# Patient Record
Sex: Male | Born: 1995 | Race: White | Hispanic: No | Marital: Single | Smoking: Never smoker
Health system: Southern US, Community
[De-identification: ages and names within clinical notes are randomized; demographics above are authoritative.]

## PROBLEM LIST (undated history)

## (undated) HISTORY — PX: CRANIOTOMY: SHX93

---
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
## 2015-05-16 ENCOUNTER — Encounter: Payer: Self-pay | Admitting: Emergency Medicine

## 2015-05-16 ENCOUNTER — Ambulatory Visit: Payer: Medicaid Other

## 2015-05-16 ENCOUNTER — Ambulatory Visit
Admission: EM | Admit: 2015-05-16 | Discharge: 2015-05-16 | Disposition: A | Discharge status: Home / Self Care | Payer: Medicaid Other | Attending: Family Medicine | Admitting: Family Medicine

## 2015-05-16 DIAGNOSIS — X58XXXA Exposure to other specified factors, initial encounter: Secondary | ICD-10-CM | POA: Insufficient documentation

## 2015-05-16 DIAGNOSIS — S71131A Puncture wound without foreign body, right thigh, initial encounter: Secondary | ICD-10-CM | POA: Diagnosis not present

## 2015-05-16 MED ORDER — SULFAMETHOXAZOLE-TRIMETHOPRIM 800-160 MG PO TABS: 1.0000 | ORAL_TABLET | Freq: Two times a day (BID) | ORAL | Status: AC

## 2015-05-16 MED ORDER — MUPIROCIN 2 % EX OINT: 1.0000 "application " | TOPICAL_OINTMENT | Freq: Three times a day (TID) | CUTANEOUS | Status: DC

## 2015-05-16 MED ORDER — TETANUS-DIPHTH-ACELL PERTUSSIS 5-2.5-18.5 LF-MCG/0.5 IM SUSP
0.5000 mL | Freq: Once | INTRAMUSCULAR | Status: AC
  Administered 2015-05-16: 0.5 mL via INTRAMUSCULAR

## 2015-05-16 NOTE — Discharge Instructions (Signed)
As we discussed, there my be a pink halo around the injury area in the next few days- however if it extends beyond fingertip size or becomes hot, inflamed or draining we need to see you back to re-evaluate I have ordered TMP-SMZ antibiotic twice daily for 7 days And topical mupirocin to apply after scrubbing wound as we discussed. Some local tenderness is to be expected as there may be some deep bruising from the post you may use a heat pad to the area as you wish Please return if you have questions or concerns  Puncture Wound A puncture wound is an injury that is caused by a sharp, thin object that goes through (penetrates) your skin. Usually, a puncture wound does not leave a large opening in your skin, so it may not bleed a lot. However, when you get a puncture wound, dirt or other materials (foreign bodies) can be forced into your wound and break off inside. This increases the chance of infection, such as tetanus. CAUSES Puncture wounds are caused by any sharp, thin object that goes through your skin, such as:  Animal teeth, as with an animal bite.  Sharp, pointed objects, such as nails, splinters of glass, fishhooks, and needles. SYMPTOMS Symptoms of a puncture wound include:  Pain.  Bleeding.  Swelling.  Bruising.  Fluid leaking from the wound.  Numbness, tingling, or loss of function. DIAGNOSIS This condition is diagnosed with a medical history and physical exam. Your wound will be checked to see if it contains any foreign bodies. You may also have X-rays or other imaging tests. TREATMENT Treatment for a puncture wound depends on how serious the wound is. It also depends on whether the wound contains any foreign bodies. Treatment for all types of puncture wounds usually starts with:  Controlling the bleeding.  Washing out the wound with a germ-free (sterile) salt-water solution.  Checking the wound for foreign bodies. Treatment may also include:  Having the wound opened  surgically to remove a foreign object.  Closing the wound with stitches (sutures) if it continues to bleed.  Covering the wound with antibiotic ointments and a bandage (dressing).  Receiving a tetanus shot.  Receiving a rabies vaccine. HOME CARE INSTRUCTIONS Medicines  Take or apply over-the-counter and prescription medicines only as told by your health care provider.  If you were prescribed an antibiotic, take or apply it as told by your health care provider. Do not stop using the antibiotic even if your condition improves. Wound Care  There are many ways to close and cover a wound. For example, a wound can be covered with sutures, skin glue, or adhesive strips. Follow instructions from your health care provider about:  How to take care of your wound.  When and how you should change your dressing.  When you should remove your dressing.  Removing whatever was used to close your wound.  Keep the dressing dry as told by your health care provider. Do not take baths, swim, use a hot tub, or do anything that would put your wound underwater until your health care provider approves.  Clean the wound as told by your health care provider.  Do not scratch or pick at the wound.  Check your wound every day for signs of infection. Watch for:  Redness, swelling, or pain.  Fluid, blood, or pus. General Instructions  Raise (elevate) the injured area above the level of your heart while you are sitting or lying down.  If your puncture wound is in  your foot, ask your health care provider if you need to avoid putting weight on your foot and for how long.  Keep all follow-up visits as told by your health care provider. This is important. SEEK MEDICAL CARE IF:  You received a tetanus shot and you have swelling, severe pain, redness, or bleeding at the injection site.  You have a fever.  Your sutures come out.  You notice a bad smell coming from your wound or your dressing.  You  notice something coming out of your wound, such as wood or glass.  Your pain is not controlled with medicine.  You have increased redness, swelling, or pain at the site of your wound.  You have fluid, blood, or pus coming from your wound.  You notice a change in the color of your skin near your wound.  You need to change the dressing frequently due to fluid, blood, or pus draining from your wound.  You develop a new rash.  You develop numbness around your wound. SEEK IMMEDIATE MEDICAL CARE IF:  You develop severe swelling around your wound.  Your pain suddenly increases and is severe.  You develop painful skin lumps.  You have a red streak going away from your wound.  The wound is on your hand or foot and you cannot properly move a finger or toe.  The wound is on your hand or foot and you notice that your fingers or toes look pale or bluish.   This information is not intended to replace advice given to you by your health care provider. Make sure you discuss any questions you have with your health care provider.   Document Released: 04/08/2005 Document Revised: 03/20/2015 Document Reviewed: 08/22/2014 Elsevier Interactive Patient Education [employer] Inc[employer]

## 2015-05-16 NOTE — ED Notes (Signed)
Pt had a rusty nail stuck into right thigh

## 2015-05-16 NOTE — ED Provider Notes (Signed)
CSN: [id number]     Arrival date & time 05/16/15  1539 History   First MD Initiated Contact with Patient 05/16/15 1606     Chief Complaint  Patient presents with  . Puncture Wound   (Consider location/radiation/quality/duration/timing/severity/associated sxs/prior Treatment) HPI 19 yo M reports pulling a fencepost out of the ground about an hour ago- did not notice a rusty nail extending- caught him in the right thigh Denies any previous tetanus Rx. 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 Ambulatory without assistance . Brings rusty bent nail from post with him.  At about 19 yo he was near the men pulling stumps with tractors and heavy chain when a chain ruptured and hit him in the forehead. Rushed to hospital with skull fractures and had cranial surgery with removal of fragmented frontal bone, replaced with a titanium plate by his report Generally does well. Weather changes can cause headaches or discomfort  History reviewed. No pertinent past medical history. Past Surgical History  Procedure Laterality Date  . 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 No pertinent family history. Social History  Substance Use Topics  . Smoking status: Never Smoker   . Smokeless tobacco: None  . Alcohol Use: No    Review of Systems  Constitutional: No fever.  Eyes: No visual changes. ENT:No sore throat. Cardiovascular:Negative for chest pain/palpitations Respiratory: Negative for shortness of breath Gastrointestinal: No abdominal pain. No nausea,vomiting, diarrhea Genitourinary: Negative for dysuria. Normal urination. Musculoskeletal: Negative for back pain. FROM extremities without pain- small wound  injury right thigh Skin: Negative for rash Neurological: Negative for headache, focal weakness or numbness   Allergies  Review of patient's  allergies indicates no known allergies.  Home Medications   Prior to Admission medications   Medication Sig Start Date End Date Taking? Authorizing Provider  mupirocin ointment (BACTROBAN) 2 % Apply 1 application topically 3 (three) times daily. 05/16/15   Rae HalstedLaurie W Amena Dockham, PA-C  sulfamethoxazole-trimethoprim (BACTRIM DS,SEPTRA DS) 800-160 MG tablet Take 1 tablet by mouth 2 (two) times daily. 05/16/15 05/23/15  Rae HalstedLaurie W Keyani Rigdon, PA-C   Meds Ordered and Administered this Visit   Medications  Tdap (BOOSTRIX) injection 0.5 mL (0.5 mLs Intramuscular Given 05/16/15 1610)  Given without difficutly. Well tolerated--discussed with him that he probably  had Tetanus with his cranial injury and with [employer] school requirements   BP 135/66 mmHg  Pulse 66  Temp(Src) 97.8 F (36.6 C) (Tympanic)  Resp 18  Ht 5' 9.5" (1.765 m)  Wt 143 lb (64.864 kg)  BMI 20.82 kg/m2  SpO2 99% No data found.   Physical Exam Constitutional: Alert and oriented, well appearing, VS are noted,  General : No acute distress; mildly concerned new injury Head:normocephalic, atraumatic,scarred from previous reported trauma/surgery  years ago  Eyes: conjugate gaze,negative conjunctiva,  Ears:Grossly normal hearing,  Mouth/throat :Mucous membranes moist,  Neck :  supple  Heart: Normal rate, regular rhythm Lung:    Normal respiratory effort and rate , no distress Back:    No CVAT, no spinal tenderness noted MSK:   nontender, normal ROM all extremities; ambulatory in unit, on and off table without assistance. Injury right medial thigh- has nail with him - no evidence breakage. Wound 3 mm, slightly teardrop shaped, c/w shape of distal nail point. No edema or eccchymosis at this time Neuro:Face symmetric, EOMI, PERRLA,tongue midline.  Grossly intact; good attention and recall,normal gait, normal speech and language, facial distortion with scarring Skin:  Warm,dry,intact Psych: mood and affect WNL  ED Course  Procedures (including critical  care time)  Labs Review Labs Reviewed - No data to display  Imaging Review Dg Femur 1v Right  05/16/2015  CLINICAL DATA:  Puncture wound to the right thigh. EXAM: RIGHT FEMUR 1 VIEW COMPARISON:  None. FINDINGS: The right hip joint is maintained. The femur is intact. No radiopaque foreign body is identified. There is a BB marking the site of the puncture wound. IMPRESSION: No fracture or radiopaque foreign body. Electronically Signed   By: Rudie Meyer M.D.   On: 05/16/2015 16:40    Wound medial thigh. Scrubbed with surgical scrub sponge. Probed with curved hemostat with no evidence of deeper tract. No possible to place wick. Films were negative for FB. No paresthesia   Good pulses and neurosensory below and surrounding wound Discussed potential for deeper contamination with puncture wounds- observation encouraged Discussed need to keep clean, gentle scrub daily , mupirocin and closed band-aid dressing with po antibiotic RX     MDM   1. Puncture wound of right thigh, initial encounter    Plan: X-ray results and diagnosis reviewed with patient Treat puncture wound for potential infection Rx as per orders;  benefits, risks, potential side effects reviewed   Recommend supportive treatment with ibuprofen or tylenol as needed Seek additional medical care if symptoms worsen or are not improving      Rae Halsted, PA-C 05/16/15 2242

## 2015-07-08 ENCOUNTER — Ambulatory Visit
Admission: EM | Admit: 2015-07-08 | Discharge: 2015-07-08 | Disposition: A | Discharge status: Home / Self Care | Payer: Medicaid Other | Attending: Family Medicine | Admitting: Family Medicine

## 2015-07-08 ENCOUNTER — Encounter: Payer: Self-pay | Admitting: Emergency Medicine

## 2015-07-08 DIAGNOSIS — J029 Acute pharyngitis, unspecified: Secondary | ICD-10-CM

## 2015-07-08 LAB — RAPID STREP SCREEN (MED CTR MEBANE ONLY): STREPTOCOCCUS, GROUP A SCREEN (DIRECT): NEGATIVE

## 2015-07-08 MED ORDER — AMOXICILLIN 875 MG PO TABS: 875.0000 mg | ORAL_TABLET | Freq: Two times a day (BID) | ORAL | Status: DC

## 2015-07-08 NOTE — ED Provider Notes (Signed)
[facility]  ____________________________________________  Time seen: Approximately 6:00 PM  I have reviewed the triage vital signs and the nursing notes.   HISTORY  Chief Complaint Sore Throat   HPI Glenn Friedman is a 19 y.o. male presents for the complaints of 3 days of sore throat. Patient reports mild nasal congestion occasionally. reports persistent sore throat. States sore throat is 6 out of 10 scratchy and hurts to swallow. Does report able to still continue to eat and drink but slight decrease in appetite. Denies pain radiation. Denies known fevers.   Denies neck pain back pain, cough, abdominal pain, dizziness, weakness, rash. Denies known sick contacts. States unrelieved with over-the-counter NyQuil and cough and congestion medicines.   History reviewed. No pertinent past medical history.  There are no active problems to display for this patient.   Past Surgical History  Procedure Laterality Date  . Craniotomy      7 yo child hit by tractor chain forehead with fragmented frontal cranial bones    Current Outpatient Rx  Name  Route  Sig  Dispense  Refill  .           Marland Kitchen.             Allergies Review of patient's allergies indicates no known allergies.  History reviewed. No pertinent family history.  Social History Social History  Substance Use Topics  . Smoking status: Never Smoker   . Smokeless tobacco: None  . Alcohol Use: No    Review of Systems Constitutional: No fever/chills Eyes: No visual changes. ENT: Positive sore throat. Cardiovascular: Denies chest pain. Respiratory: Denies shortness of breath. Gastrointestinal: No abdominal pain.  No nausea, no vomiting.  No diarrhea.  No constipation. Genitourinary: Negative for dysuria. Musculoskeletal: Negative for back pain. Skin: Negative for rash. Neurological: Negative for headaches, focal weakness or numbness.  10-point ROS otherwise  negative.  ____________________________________________   PHYSICAL EXAM:  VITAL SIGNS: ED Triage Vitals  Enc Vitals Group     BP 07/08/15 1525 137/74 mmHg     Pulse Rate 07/08/15 1525 84     Resp 07/08/15 1525 16     Temp 07/08/15 1525 98.5 F (36.9 C)     Temp Source 07/08/15 1525 Tympanic     SpO2 07/08/15 1525 100 %     Weight 07/08/15 1525 143 lb (64.864 kg)     Height 07/08/15 1525 5\' 9"  (1.753 m)     Head Cir --      Peak Flow --      Pain Score 07/08/15 1527 2     Pain Loc --      Pain Edu? --      Excl. in GC? --     Constitutional: Alert and oriented. Well appearing and in no acute distress. Eyes: Conjunctivae are normal. PERRL. EOMI. Head: Atraumatic. No sinus tenderness to palpation. No swelling. No erythema.  Ears: no erythema, normal TMs bilaterally.   Nose: Mild clear rhinorrhea. Nares patent.  Mouth/Throat: Mucous membranes are moist.  Moderate pharyngeal erythema with 3+ bilateral tonsillar swelling. No pharyngeal exudate. No uvular shift or deviation. Tolerating fluids in room. Neck: No stridor.  No cervical spine tenderness to palpation. Hematological/Lymphatic/Immunilogical: Mild anterior cervical lymphadenopathy. Cardiovascular: Normal rate, regular rhythm. Grossly normal heart sounds.  Good peripheral circulation. Respiratory: Normal respiratory effort.  No retractions. Lungs CTAB. No wheezes, rales or rhonchi. Good air movement. Gastrointestinal: Soft and nontender. No distention. Normal Bowel sounds.  No hepatomegaly or splenomegaly palpated. Musculoskeletal:  No lower or upper extremity tenderness nor edema.   Bilateral pedal pulses equal and easily palpated.  Neurologic:  Normal speech and language. No gross focal neurologic deficits are appreciated. No gait instability. Skin:  Skin is warm, dry and intact. No rash noted. Psychiatric: Mood and affect are normal. Speech and behavior are normal.  ____________________________________________    LABS (all labs ordered are listed, but only abnormal results are displayed)  Labs Reviewed  RAPID STREP SCREEN (NOT AT [facility])  CULTURE, GROUP A STREP ([facility] ONLY)     INITIAL IMPRESSION / ASSESSMENT AND PLAN / ED COURSE  Pertinent labs & imaging results that were available during my care of the patient were reviewed by me and considered in my medical decision making (see chart for details).  Very well-appearing patient. No acute distress. Presents for the complaints of sore throat 3 days. Lungs clear throughout. Abdomen soft and nontender. Moist mucous membranes. Reports continues to eat and drink well.  Moderate pharyngeal erythema and tonsillar swelling. Quick strep negative, will culture. Concern for streptococcal pharyngitis. Due to tonsillar swelling and erythema will go ahead and treat with oral amoxicillin due to concern for streptococcal pharyngitis.. Counseled regarding rest, fluids, over-the-counter Tylenol or ibuprofen as needed. Counsel PCP follow up. Counseled regarding of sore throat continues to have close follow-up.   Discussed follow up with Primary care physician this week. Discussed follow up and return parameters including no resolution or any worsening concerns. Patient verbalized understanding and agreed to plan.   ____________________________________________   FINAL CLINICAL IMPRESSION(S) / ED DIAGNOSES  Final diagnoses:  Pharyngitis       Renford Dills, NP 07/08/15 1807

## 2015-07-08 NOTE — Discharge Instructions (Signed)

## 2015-07-08 NOTE — ED Notes (Signed)
Patient c/o sore throat for 3 days

## 2015-07-11 LAB — CULTURE, GROUP A STREP (THRC)

## 2015-11-11 ENCOUNTER — Ambulatory Visit
Admission: EM | Admit: 2015-11-11 | Discharge: 2015-11-11 | Disposition: A | Discharge status: Home / Self Care | Payer: Medicaid Other | Attending: Family Medicine | Admitting: Family Medicine

## 2015-11-11 ENCOUNTER — Encounter: Payer: Self-pay | Admitting: *Deleted

## 2015-11-11 DIAGNOSIS — L255 Unspecified contact dermatitis due to plants, except food: Secondary | ICD-10-CM | POA: Diagnosis not present

## 2015-11-11 MED ORDER — LORATADINE 10 MG PO TABS: 10.0000 mg | ORAL_TABLET | Freq: Every day | ORAL | Status: DC

## 2015-11-11 MED ORDER — CETIRIZINE HCL 10 MG PO TABS: 10.0000 mg | ORAL_TABLET | Freq: Every day | ORAL | Status: DC

## 2015-11-11 MED ORDER — RANITIDINE HCL 150 MG PO CAPS: 150.0000 mg | ORAL_CAPSULE | Freq: Two times a day (BID) | ORAL | Status: AC

## 2015-11-11 MED ORDER — PREDNISONE 10 MG (21) PO TBPK: ORAL_TABLET | ORAL | Status: DC

## 2015-11-11 NOTE — ED Provider Notes (Addendum)
CSN: [id number]     Arrival date & time 11/11/15  1811 History   First MD Initiated Contact with Patient 11/11/15 2026    Nurses notes were reviewed. Chief Complaint  Patient presents with  . Poison Ivy  Patient is here because of poison ivy. States he was out in the woods on Friday by Friday evening he was itching. States he washes hands this despite that still itched nothing seemed to help. He reports that she has gotten worse. Last poison ivy or poison oak ecchymosis on the hand he didn't have it on his face but now spread to space chest and to his back. And abdomen. His nausea symptoms been on oral prednisone for.  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 He's had a craniotomy is a 53-year-old child smoke.Significant medical history other than cutting history and no significant family medical problems.  (Consider location/radiation/quality/duration/timing/severity/associated sxs/prior Treatment) Patient is a 20 y.o. male presenting with poison ivy. The history is provided by the patient. No language interpreter was used.  Poison [place] This is a new problem. The current episode started more than 2 days ago. The problem occurs constantly. The problem has been gradually worsening. Pertinent negatives include no chest pain, no abdominal pain, no headaches and no shortness of breath. Nothing aggravates the symptoms. Nothing relieves the symptoms. Treatments tried: Anti-itch creams. The treatment provided no relief.    History reviewed. No pertinent past medical history. Past Surgical History  Procedure Laterality Date  . 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 No pertinent family history. Social History  Substance Use Topics  . Smoking status: Never Smoker   . Smokeless tobacco: Never Used  . Alcohol Use: No    Review of Systems  HENT: Positive for facial  swelling.   Respiratory: Negative for shortness of breath.   Cardiovascular: Negative for chest pain.  Gastrointestinal: Negative for abdominal pain.  Skin: Positive for rash. Negative for color change.  Neurological: Negative for headaches.  All other systems reviewed and are negative.   Allergies  Gluten meal and Poison ivy extract  Home Medications   Prior to Admission medications   Medication Sig Start Date End Date Taking? Authorizing Provider  amoxicillin (AMOXIL) 875 MG tablet Take 1 tablet (875 mg total) by mouth 2 (two) times daily. 07/08/15   Renford Dills, NP  cetirizine (ZYRTEC) 10 MG tablet Take 1 tablet (10 mg total) by mouth daily. If needed at night for itching not relieved by Claritin in the morning. 11/11/15   Hassan Rowan, MD  loratadine (CLARITIN) 10 MG tablet Take 1 tablet (10 mg total) by mouth daily. Take 1 tablet in the morning. As needed for itching. 11/11/15   Hassan Rowan, MD  mupirocin ointment (BACTROBAN) 2 % Apply 1 application topically 3 (three) times daily. 05/16/15   Rae Halsted, PA-C  predniSONE (STERAPRED UNI-PAK 21 TAB) 10 MG (21) TBPK tablet 6 tabs day 1 and 2, 5 tabs day 3 and 4, 4 tabs day 5 and 6, 3 tabs day 7 and 8, 2 tabs day 9 and 10, 1 tab day 11 and 12. Take orally 11/11/15   Hassan Rowan, MD  ranitidine (ZANTAC) 150 MG capsule Take 1 capsule (150 mg total) by mouth 2 (two) times daily. 11/11/15   Hassan Rowan, MD   Meds Ordered and Administered this  Visit  Medications - No data to display  BP 131/85 mmHg  Pulse 70  Temp(Src) 98.6 F (37 C) (Oral)  Resp 18  Ht 5\' 9"  (1.753 m)  Wt 150 lb (68.04 kg)  BMI 22.14 kg/m2  SpO2 100% No data found.   Physical Exam  Constitutional: He is oriented to person, place, and time. He appears well-developed and well-nourished.  HENT:  Head: Normocephalic and atraumatic.  Eyes: Pupils are equal, round, and reactive to light.  Neck: Normal range of motion. No tracheal deviation present.  Musculoskeletal:  Normal range of motion.  Neurological: He is alert and oriented to person, place, and time.  Skin: Rash noted.     Scars present on the left forearm where he's had himself before. Over the general body he has a rash consistent with contact dermatitischest and on both arms.  Psychiatric: He has a normal mood and affect. His behavior is normal.  Vitals reviewed.   ED Course  Procedures (including critical care time)  Labs Review Labs Reviewed - No data to display  Imaging Review No results found.   Visual Acuity Review  Right Eye Distance:   Left Eye Distance:   Bilateral Distance:    Right Eye Near:   Left Eye Near:    Bilateral Near:         MDM   1. Contact dermatitis due to plant   Will place on 12 day course of dose pack. Stressed the need to take all 12 days worth. Place on Claritin 10 mg in the morning and Zyrtec 10 mg at night for the itching. Zantac twice a day also help with itching stay away from Benadryl and follow-up PCP if not better in 2 weeks. He was informed that the Claritin Zyrtec can be used for itching as well as Zantac and those medicines are when necessary medications. The prednisone he has take the full 12 days until completely gone..   Note: This dictation was prepared with Dragon dictation along with smaller phrase technology. Any transcriptional errors that result from this process are unintentional.    Hassan RowanEugene Jung Yurchak, MD 11/11/15 [id number]  Hassan RowanEugene Bular Hickok, MD 11/11/15 2054

## 2015-11-11 NOTE — Discharge Instructions (Signed)
[facility] Po[facility] oak is a rash caused by touching the leaves of the poison oak plant. You may have a rash with redness and itching. Sometimes, blisters appear and break open. Your eyes may get puffy (swollen). Poison oak often heals in 2 to 3 weeks without treatment.  HOME CARE  If you touch poison oak:  Wash your skin with soap and water right away. Wash under your fingernails. Do not rub the skin very hard.  Wash any clothes you were wearing.  Avoid poison oak in the future. Poison oak usually has 3 leaves on a stem.  Use medicines to help with itching as told by your doctor. Do not drive when you take this medicine.  Keep open sores dry, clean, and covered with a bandage and medicated cream, if needed.  Ask your doctor about medicine for children. GET HELP RIGHT AWAY IF:  You have open sores.  Redness spreads beyond the area of the rash.  There is yellowish white fluid (pus) coming from the rash.  Pain gets worse.  You have a temperature by mouth above 102 F (38.9 C), not controlled by medicine. MAKE SURE YOU:  Understand these instructions.  Will watch your condition.  Will get help right away if you are not doing well or get worse.   This information is not intended to replace advice given to you by your health care provider. Make sure you discuss any questions you have with your health care provider.   Document Released: 08/01/2010 Document Revised: 09/21/2011 Document Reviewed: 12/05/2014 Elsevier Interactive Patient Education 2016 Elsevier Inc.  Contact Dermatitis Dermatitis is redness, soreness, and swelling (inflammation) of the skin. Contact dermatitis is a reaction to certain substances that touch the skin. You either touched something that irritated your skin, or you have allergies to something you touched.  HOME CARE  Skin Care  Moisturize your skin as needed.  Apply cool compresses to the affected areas.   Try taking a bath with:   Epsom salts.  Follow the instructions on the package. You can get these at a pharmacy or grocery store.   Baking soda. Pour a small amount into the bath as told by your doctor.   Colloidal oatmeal. Follow the instructions on the package. You can get this at a pharmacy or grocery store.   Try applying baking soda paste to your skin. Stir water into baking soda until it looks like paste.  Do not scratch your skin.   Bathe less often.  Bathe in lukewarm water. Avoid using hot water.  Medicines  Take or apply over-the-counter and prescription medicines only as told by your doctor.   If you were prescribed an antibiotic medicine, take or apply your antibiotic as told by your doctor. Do not stop taking the antibiotic even if your condition starts to get better. General Instructions  Keep all follow-up visits as told by your doctor. This is important.   Avoid the substance that caused your reaction. If you do not know what caused it, keep a journal to try to track what caused it. Write down:   What you eat.   What cosmetic products you use.   What you drink.   What you wear in the affected area. This includes jewelry.   If you were given a bandage (dressing), take care of it as told by your doctor. This includes when to change and remove it.  GET HELP IF:   You do not get better with treatment.  Your condition gets worse.   You have signs of infection such as:  Swelling.  Tenderness.  Redness.  Soreness.  Warmth.   You have a fever.   You have new symptoms.  GET HELP RIGHT AWAY IF:   You have a very bad headache.  You have neck pain.  Your neck is stiff.   You throw up (vomit).   You feel very sleepy.   You see red streaks coming from the affected area.   Your bone or joint underneath the affected area becomes painful after the skin has healed.   The affected area turns darker.   You have trouble breathing.    This information is not  intended to replace advice given to you by your health care provider. Make sure you discuss any questions you have with your health care provider.   Document Released: 04/26/2009 Document Revised: 03/20/2015 Document Reviewed: 11/14/2014 Elsevier Interactive Patient Education 2016 Elsevier Inc.  Rash A rash is a change in the color or feel of your skin. There are many different types of rashes. You may have other problems along with your rash. HOME CARE  Avoid the thing that caused your rash.  Do not scratch your rash.  You may take cools baths to help stop itching.  Only take medicines as told by your doctor.  Keep all doctor visits as told. GET HELP RIGHT AWAY IF:   Your pain, puffiness (swelling), or redness gets worse.  You have a fever.  You have new or severe problems.  You have body aches, watery poop (diarrhea), or you throw up (vomit).  Your rash is not better after 3 days. MAKE SURE YOU:   Understand these instructions.  Will watch your condition.  Will get help right away if you are not doing well or get worse.   This information is not intended to replace advice given to you by your health care provider. Make sure you discuss any questions you have with your health care provider.   Document Released: 12/16/2007 Document Revised: 09/21/2011 Document Reviewed: 11/14/2014 Elsevier Interactive Patient Education [employer]

## 2015-11-11 NOTE — ED Notes (Signed)
Patient was exposed to poison ivy 3 days ago and immediately began breaking out into a rash. Patient has a history of allergies to poison ivy.

## 2016-01-17 ENCOUNTER — Ambulatory Visit
Admission: EM | Admit: 2016-01-17 | Discharge: 2016-01-17 | Disposition: A | Discharge status: Home / Self Care | Payer: Medicaid Other | Attending: Family Medicine | Admitting: Family Medicine

## 2016-01-17 DIAGNOSIS — L237 Allergic contact dermatitis due to plants, except food: Secondary | ICD-10-CM

## 2016-01-17 MED ORDER — TRIAMCINOLONE ACETONIDE 0.025 % EX OINT: 1.0000 "application " | TOPICAL_OINTMENT | Freq: Two times a day (BID) | CUTANEOUS | Status: DC

## 2016-01-17 MED ORDER — PREDNISONE 20 MG PO TABS: 20.0000 mg | ORAL_TABLET | Freq: Every day | ORAL | Status: DC

## 2016-01-17 NOTE — ED Provider Notes (Signed)
CSN: [id number]     Arrival date & time 01/17/16  1256 History   First MD Initiated Contact with Patient 01/17/16 1404     Chief Complaint  Patient presents with  . Rash   (Consider location/radiation/quality/duration/timing/severity/associated sxs/prior Treatment) HPI Comments: 20 yo male with c/o itchy rash to the arms and abdomen after exposure to poison oak. Has tried over the counter topical medications without relief.   Patient is a 20 y.o. male presenting with rash. The history is provided by the patient.  Rash   History reviewed. No pertinent past medical history. Past Surgical History  Procedure Laterality Date  . 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 No pertinent family history. Social History  Substance Use Topics  . Smoking status: Never Smoker   . Smokeless tobacco: Never Used  . Alcohol Use: No    Review of Systems  Skin: Positive for rash.    Allergies  Gluten meal and Poison ivy extract  Home Medications   Prior to Admission medications   Medication Sig Start Date End Date Taking? Authorizing Provider  amoxicillin (AMOXIL) 875 MG tablet Take 1 tablet (875 mg total) by mouth 2 (two) times daily. 07/08/15   Renford DillsLindsey Miller, NP  cetirizine (ZYRTEC) 10 MG tablet Take 1 tablet (10 mg total) by mouth daily. If needed at night for itching not relieved by Claritin in the morning. 11/11/15   Hassan RowanEugene Wade, MD  loratadine (CLARITIN) 10 MG tablet Take 1 tablet (10 mg total) by mouth daily. Take 1 tablet in the morning. As needed for itching. 11/11/15   Hassan RowanEugene Wade, MD  mupirocin ointment (BACTROBAN) 2 % Apply 1 application topically 3 (three) times daily. 05/16/15   Rae HalstedLaurie W Lee, PA-C  predniSONE (DELTASONE) 20 MG tablet Take 1 tablet (20 mg total) by mouth daily. 01/17/16   Payton Mccallumrlando Vila Dory, MD  ranitidine (ZANTAC) 150 MG capsule Take 1 capsule (150 mg total) by mouth 2 (two) times daily. 11/11/15   Hassan RowanEugene  Wade, MD  triamcinolone (KENALOG) 0.025 % ointment Apply 1 application topically 2 (two) times daily. 01/17/16   Payton Mccallumrlando Preethi Scantlebury, MD   Meds Ordered and Administered this Visit  Medications - No data to display  BP 123/60 mmHg  Pulse 82  Temp(Src) 97.8 F (36.6 C) (Tympanic)  Resp 17  Ht 5\' 10"  (1.778 m)  Wt 145 lb (65.772 kg)  BMI 20.81 kg/m2  SpO2 98% No data found.   Physical Exam  Constitutional: He appears well-developed and well-nourished. No distress.  Skin: Rash noted. He is not diaphoretic.  Cluster skin areas with pinpoint blisters with surrounding erythema on forearms, abdominal skin  Nursing note and vitals reviewed.   ED Course  Procedures (including critical care time)  Labs Review Labs Reviewed - No data to display  Imaging Review No results found.   Visual Acuity Review  Right Eye Distance:   Left Eye Distance:   Bilateral Distance:    Right Eye Near:   Left Eye Near:    Bilateral Near:         MDM   1. Contact dermatitis due to poison ivy    Discharge Medication List as of 01/17/2016  2:17 PM    START taking these medications   Details  predniSONE (DELTASONE) 20 MG tablet Take 1 tablet (20 mg total) by mouth daily., Starting 01/17/2016, Until Discontinued, Normal    triamcinolone (KENALOG) 0.025 %  ointment Apply 1 application topically 2 (two) times daily., Starting 01/17/2016, Until Discontinued, Normal       1. diagnosis reviewed with patient/parent/guardian/family 2. rx as per orders above; reviewed possible side effects, interactions, risks and benefits  3. Recommend supportive treatment with otc antihistamines prn  4. Follow-up prn if symptoms worsen or don't improve    Payton Mccallumrlando Jodette Wik, MD 01/17/16 1642

## 2016-01-17 NOTE — ED Notes (Signed)
Patient complains of poison oak/ivy all over body. Patient states that rash initially started around his waist 2 weeks ago and has been spreading. Patient states that rash is itchy. Patient states that he has tried Calamine lotion without relief.

## 2016-01-17 NOTE — Discharge Instructions (Signed)
Contact Dermatitis Dermatitis is redness, soreness, and swelling (inflammation) of the skin. Contact dermatitis is a reaction to certain substances that touch the skin. There are two types of contact dermatitis:   Irritant contact dermatitis. This type is caused by something that irritates your skin, such as dry hands from washing them too much. This type does not require previous exposure to the substance for a reaction to occur. This type is more common.  Allergic contact dermatitis. This type is caused by a substance that you are allergic to, such as a nickel allergy or poison ivy. This type only occurs if you have been exposed to the substance (allergen) before. Upon a repeat exposure, your body reacts to the substance. This type is less common. CAUSES  Many different substances can cause contact dermatitis. Irritant contact dermatitis is most commonly caused by exposure to:   Makeup.   Soaps.   Detergents.   Bleaches.   Acids.   Metal salts, such as nickel.  Allergic contact dermatitis is most commonly caused by exposure to:   Poisonous plants.   Chemicals.   Jewelry.   Latex.   Medicines.   Preservatives in products, such as clothing.  RISK FACTORS This condition is more likely to develop in:   People who have jobs that expose them to irritants or allergens.  People who have certain medical conditions, such as asthma or eczema.  SYMPTOMS  Symptoms of this condition may occur anywhere on your body where the irritant has touched you or is touched by you. Symptoms include:  Dryness or flaking.   Redness.   Cracks.   Itching.   Pain or a burning feeling.   Blisters.  Drainage of small amounts of blood or clear fluid from skin cracks. With allergic contact dermatitis, there may also be swelling in areas such as the eyelids, mouth, or genitals.  DIAGNOSIS  This condition is diagnosed with a medical history and physical exam. A patch skin test  may be performed to help determine the cause. If the condition is related to your job, you may need to see an occupational medicine specialist. TREATMENT Treatment for this condition includes figuring out what caused the reaction and protecting your skin from further contact. Treatment may also include:   Steroid creams or ointments. Oral steroid medicines may be needed in more severe cases.  Antibiotics or antibacterial ointments, if a skin infection is present.  Antihistamine lotion or an antihistamine taken by mouth to ease itching.  A bandage (dressing). HOME CARE INSTRUCTIONS Skin Care  Moisturize your skin as needed.   Apply cool compresses to the affected areas.  Try taking a bath with:  Epsom salts. Follow the instructions on the packaging. You can get these at your local pharmacy or grocery store.  Baking soda. Pour a small amount into the bath as directed by your health care provider.  Colloidal oatmeal. Follow the instructions on the packaging. You can get this at your local pharmacy or grocery store.  Try applying baking soda paste to your skin. Stir water into baking soda until it reaches a paste-like consistency.  Do not scratch your skin.  Bathe less frequently, such as every other day.  Bathe in lukewarm water. Avoid using hot water. Medicines  Take or apply over-the-counter and prescription medicines only as told by your health care provider.   If you were prescribed an antibiotic medicine, take or apply your antibiotic as told by your health care provider. Do not stop using the   antibiotic even if your condition starts to improve. General Instructions  Keep all follow-up visits as told by your health care provider. This is important.  Avoid the substance that caused your reaction. If you do not know what caused it, keep a journal to try to track what caused it. Write down:  What you eat.  What cosmetic products you use.  What you drink.  What  you wear in the affected area. This includes jewelry.  If you were given a dressing, take care of it as told by your health care provider. This includes when to change and remove it. SEEK MEDICAL CARE IF:   Your condition does not improve with treatment.  Your condition gets worse.  You have signs of infection such as swelling, tenderness, redness, soreness, or warmth in the affected area.  You have a fever.  You have new symptoms. SEEK IMMEDIATE MEDICAL CARE IF:   You have a severe headache, neck pain, or neck stiffness.  You vomit.  You feel very sleepy.  You notice red streaks coming from the affected area.  Your bone or joint underneath the affected area becomes painful after the skin has healed.  The affected area turns darker.  You have difficulty breathing.   This information is not intended to replace advice given to you by your health care provider. Make sure you discuss any questions you have with your health care provider.   Document Released: 06/26/2000 Document Revised: 03/20/2015 Document Reviewed: 11/14/2014 Elsevier Interactive Patient Education 2016 Elsevier Inc.  

## 2016-06-16 ENCOUNTER — Ambulatory Visit
Admission: EM | Admit: 2016-06-16 | Discharge: 2016-06-16 | Disposition: A | Discharge status: Home / Self Care | Payer: Medicaid Other | Attending: Emergency Medicine | Admitting: Emergency Medicine

## 2016-06-16 DIAGNOSIS — J029 Acute pharyngitis, unspecified: Secondary | ICD-10-CM | POA: Insufficient documentation

## 2016-06-16 LAB — RAPID STREP SCREEN (MED CTR MEBANE ONLY): STREPTOCOCCUS, GROUP A SCREEN (DIRECT): NEGATIVE

## 2016-06-16 MED ORDER — IPRATROPIUM BROMIDE 0.06 % NA SOLN: 2.0000 | Freq: Four times a day (QID) | NASAL | 0 refills | Status: AC

## 2016-06-16 MED ORDER — IBUPROFEN 800 MG PO TABS: 800.0000 mg | ORAL_TABLET | Freq: Three times a day (TID) | ORAL | 0 refills | Status: AC

## 2016-06-16 NOTE — ED Provider Notes (Signed)
HPI  SUBJECTIVE:  Patient reports sore throat starting 2-3 days ago. Sx worse with swallowing, heavy breathing.  Sx better with nothing. Has been taking NyQuil w/ o relief.  No Fever  + Swollen neck glands    + Cough/URI sxs with nasal congestion, postnasal drip, rhinorrhea No Myalgias No Headache No Rash     No Recent Strep or mono Exposure No Abdominal Pain No reflux sxs No Allergy sxs  No Breathing difficulty, voice changes No Drooling No Trismus No abx in past month. No antipyretic in past 4-6 hrs  Past medical history strep throat. No history of GERD, diabetes, hypertension. PMD: None   History reviewed. No pertinent past medical history.  Past Surgical History:  Procedure Laterality Date  . CRANIOTOMY     7 yo child hit by tractor chain forehead with fragmented frontal cranial bones    History reviewed. No pertinent family history.  Social History  Substance Use Topics  . Smoking status: Never Smoker  . Smokeless tobacco: Never Used  . Alcohol use No    No current facility-administered medications for this encounter.   Current Outpatient Prescriptions:  .  ibuprofen (ADVIL,MOTRIN) 800 MG tablet, Take 1 tablet (800 mg total) by mouth 3 (three) times daily., Disp: 30 tablet, Rfl: 0 .  ipratropium (ATROVENT) 0.06 % nasal spray, Place 2 sprays into both nostrils 4 (four) times daily. 3-4 times/ day, Disp: 15 mL, Rfl: 0 .  ranitidine (ZANTAC) 150 MG capsule, Take 1 capsule (150 mg total) by mouth 2 (two) times daily., Disp: 60 capsule, Rfl: 0  Allergies  Allergen Reactions  . Gluten Meal Rash  . Poison Ivy Extract [Poison Ivy Extract] Rash     ROS  As noted in HPI.   Physical Exam  BP 127/75 (BP Location: Left Arm)   Pulse 69   Temp 98.5 F (36.9 C) (Oral)   Resp 16   Ht 5\' 10"  (1.778 m)   Wt 150 lb (68 kg)   SpO2 100%   BMI 21.52 kg/m   Constitutional: Well developed, well nourished, no acute distress Eyes:  EOMI, conjunctiva normal  bilaterally HENT: Normocephalic, atraumatic,mucus membranes moist. TMs normal bilaterally. +nasal congestion +  erythematous oropharynx +  enlarged tonsils - exudates. Uvula midline.  Respiratory: Normal inspiratory effort Cardiovascular: Normal rate, no murmurs, rubs, gallops GI: nondistended, nontender. No appreciable splenomegaly skin: No rash, skin intact Lymph: -  cervical LN  Musculoskeletal: no deformities Neurologic: Alert & oriented x 3, no focal neuro deficits Psychiatric: Speech and behavior appropriate  ED Course   Medications - No data to display  Orders Placed This Encounter  Procedures  . Rapid strep screen    Standing Status:   Standing    Number of Occurrences:   1    Order Specific Question:   Patient immune status    Answer:   Normal  . Culture, group A strep    Standing Status:   Standing    Number of Occurrences:   1    Results for orders placed or performed during the hospital encounter of 06/16/16 (from the past 24 hour(s))  Rapid strep screen     Status: None   Collection Time: 06/16/16 11:31 AM  Result Value Ref Range   Streptococcus, Group A Screen (Direct) NEGATIVE NEGATIVE   No results found.  ED Clinical Impression  Pharyngitis, unspecified etiology   ED Assessment/Plan  Presentation consistent with viral pharyngitis. Rapid strep negative. Obtaining throat culture to guide antibiotic treatment.  Discussed this with patient. We'll contact them if culture is positive, and will call in Appropriate antibiotics. Patient home with ibuprofen, Tylenol, Benadryl/Maalox mixture, Mucinex D, primary care referral.. will refer to local primary care resources.  Discussed labs,  MDM, plan and followup with patient Discussed sn/sx that should prompt return to the  ED. Patient  agrees with plan.   Meds ordered this encounter  Medications  . ipratropium (ATROVENT) 0.06 % nasal spray    Sig: Place 2 sprays into both nostrils 4 (four) times daily. 3-4 times/  day    Dispense:  15 mL    Refill:  0  . ibuprofen (ADVIL,MOTRIN) 800 MG tablet    Sig: Take 1 tablet (800 mg total) by mouth 3 (three) times daily.    Dispense:  30 tablet    Refill:  0     *This clinic note was created using Scientist, clinical (histocompatibility and immunogenetics)Dragon dictation software. Therefore, there may be occasional mistakes despite careful proofreading.     Domenick GongAshley Quintavious Rinck, MD 06/16/16 1346

## 2016-06-16 NOTE — ED Triage Notes (Signed)
Patient complains of sore throat that started 2-3 days ago. Patient states that hurts to swallow. Patient denies any known fevers.

## 2016-06-16 NOTE — Discharge Instructions (Signed)
your rapid strep was negative today, so we have sent off a throat culture.  We will contact you and call in the appropriate antibiotics if your culture comes back positive for an infection requiring antibiotic treatment.  Give us a working phone number.  1 gram  Tylenol and 800 mg ibuprofen together as needed for pain.  Make sure you drink plenty of extra fluids.  Some people find salt water gargles and  Traditional Medicinal's "Throat Coat" tea helpful. Take 5 mL of liquid Benadryl and 5 mL of Maalox. Mix it together, and then hold it in your mouth for as long as you can and then swallow. You may do this 4 times a day.   ° °Go to www.goodrx.com to look up your medications. This will give you a list of where you can find your prescriptions at the most affordable prices.  °

## 2016-06-19 LAB — CULTURE, GROUP A STREP (THRC)

## 2016-12-28 IMAGING — CR DG FEMUR 1V*R*
2 series · 3 of 3 positions shown · non-contrast
Comparison: None.

CLINICAL DATA: Puncture wound to the right thigh.

EXAM:
RIGHT FEMUR 1 VIEW

[Series 1: femur lat · 0.14mm/px · 2 of 2 slices shown (1 of 2)]
[im 1/2]
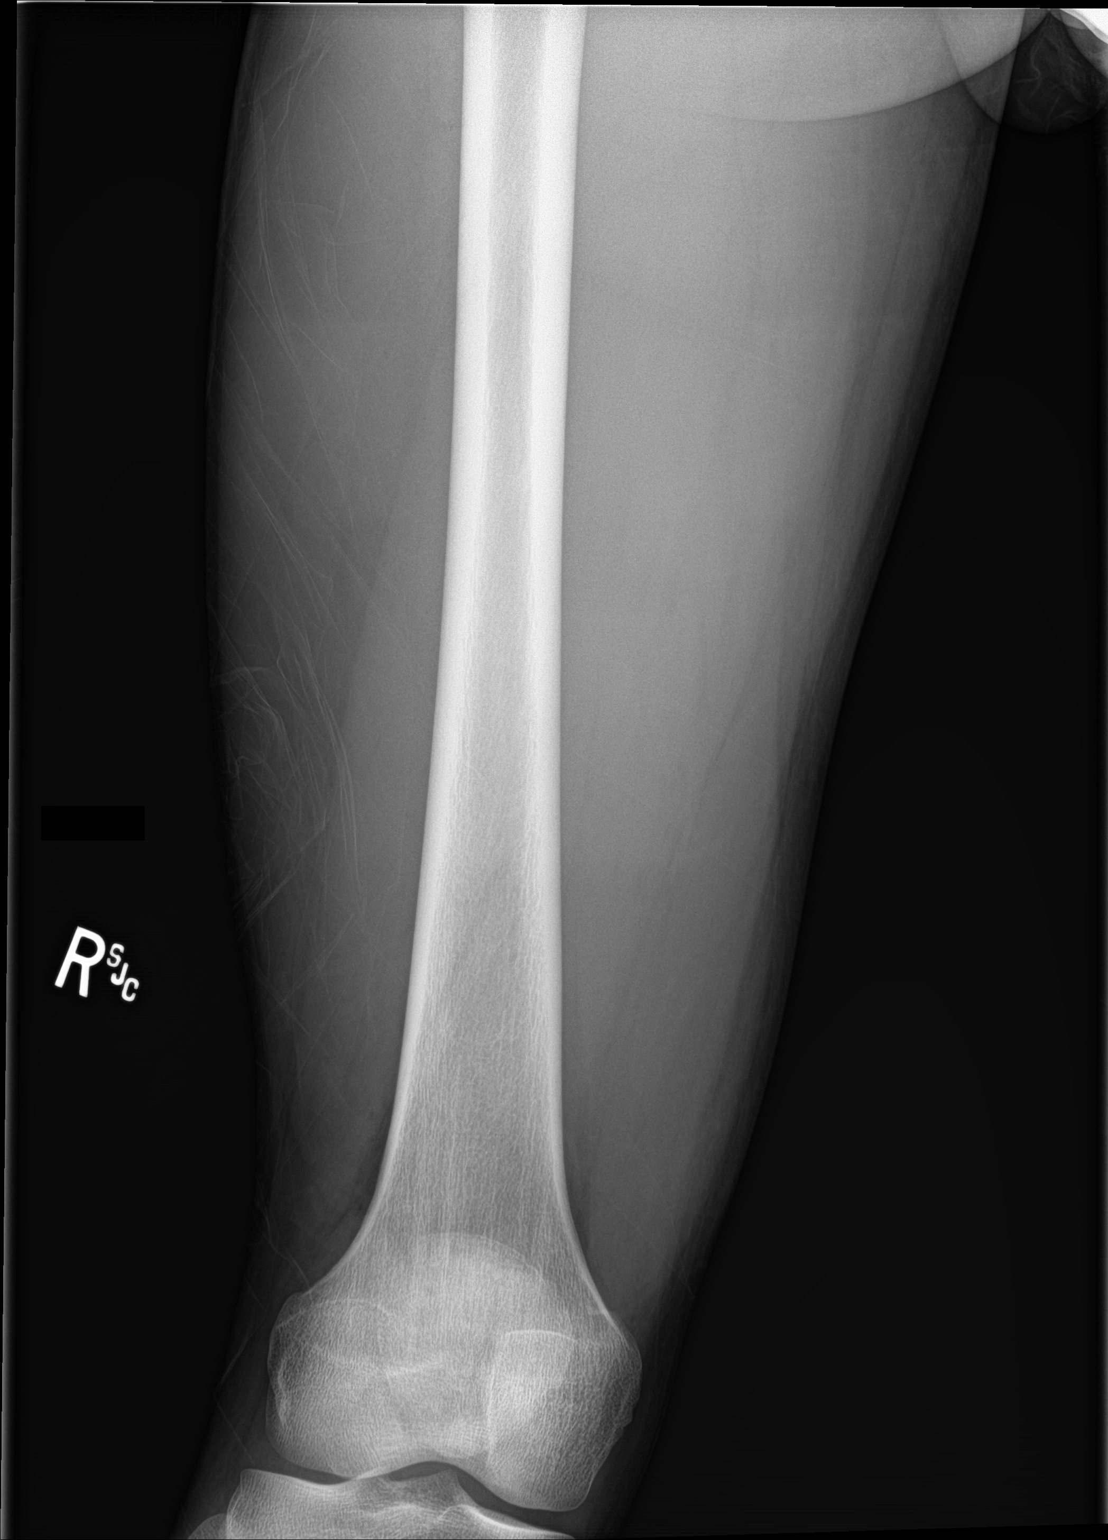
[im 2/2]
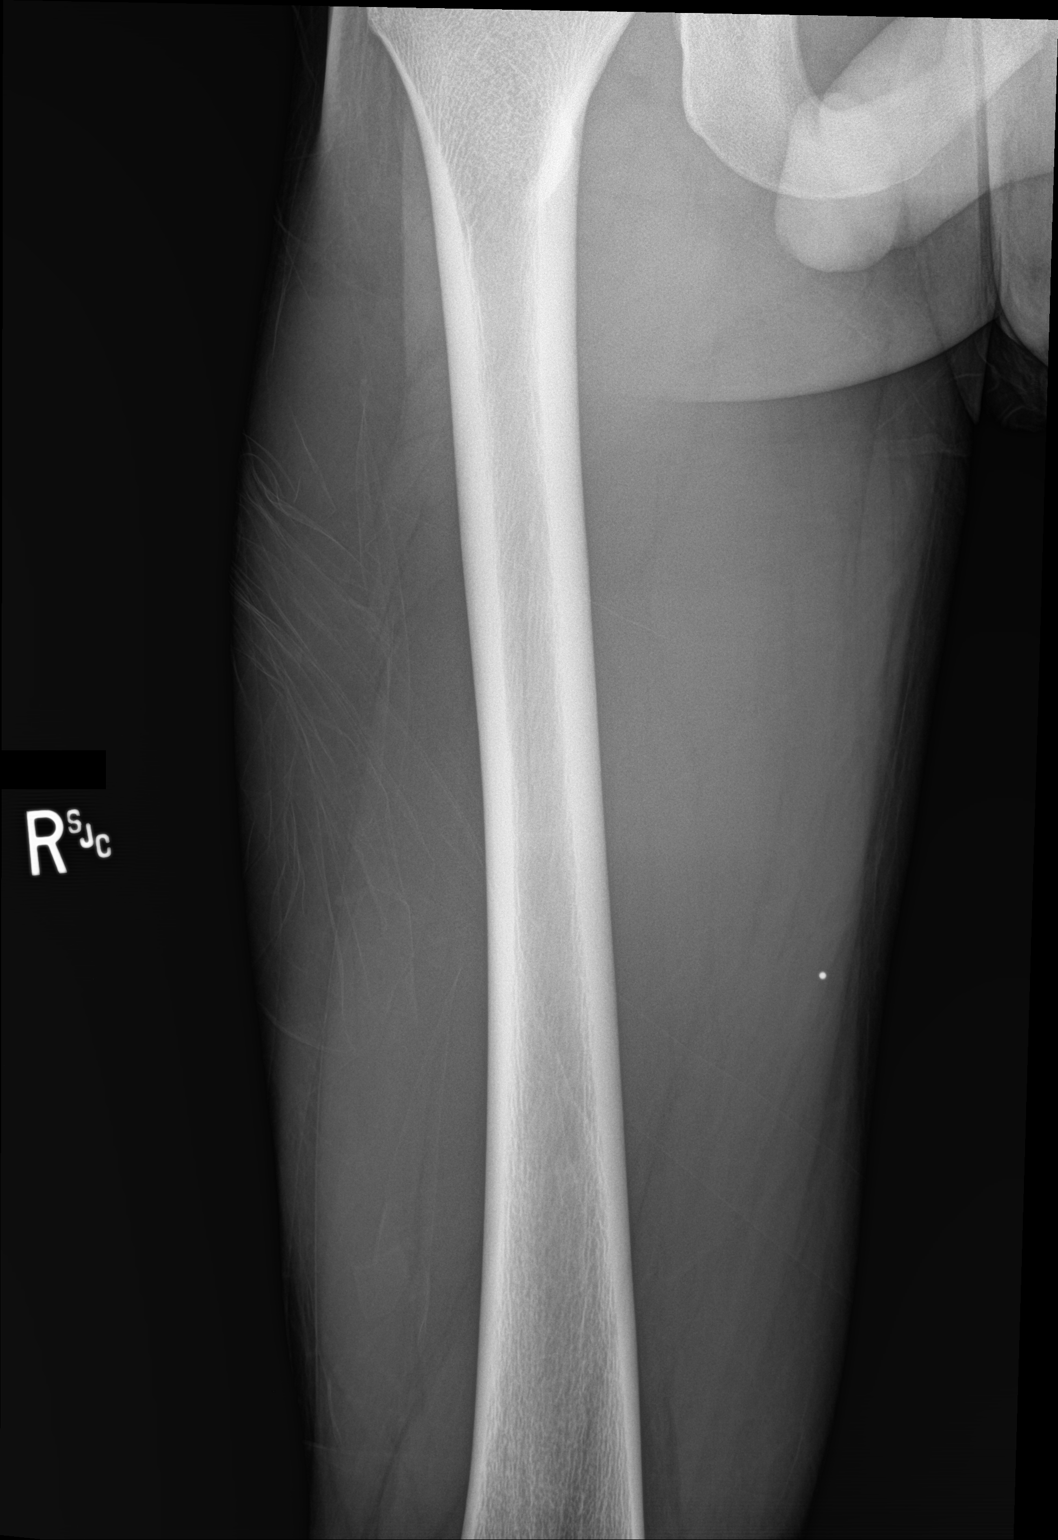

[femur lat (2 of 2)]
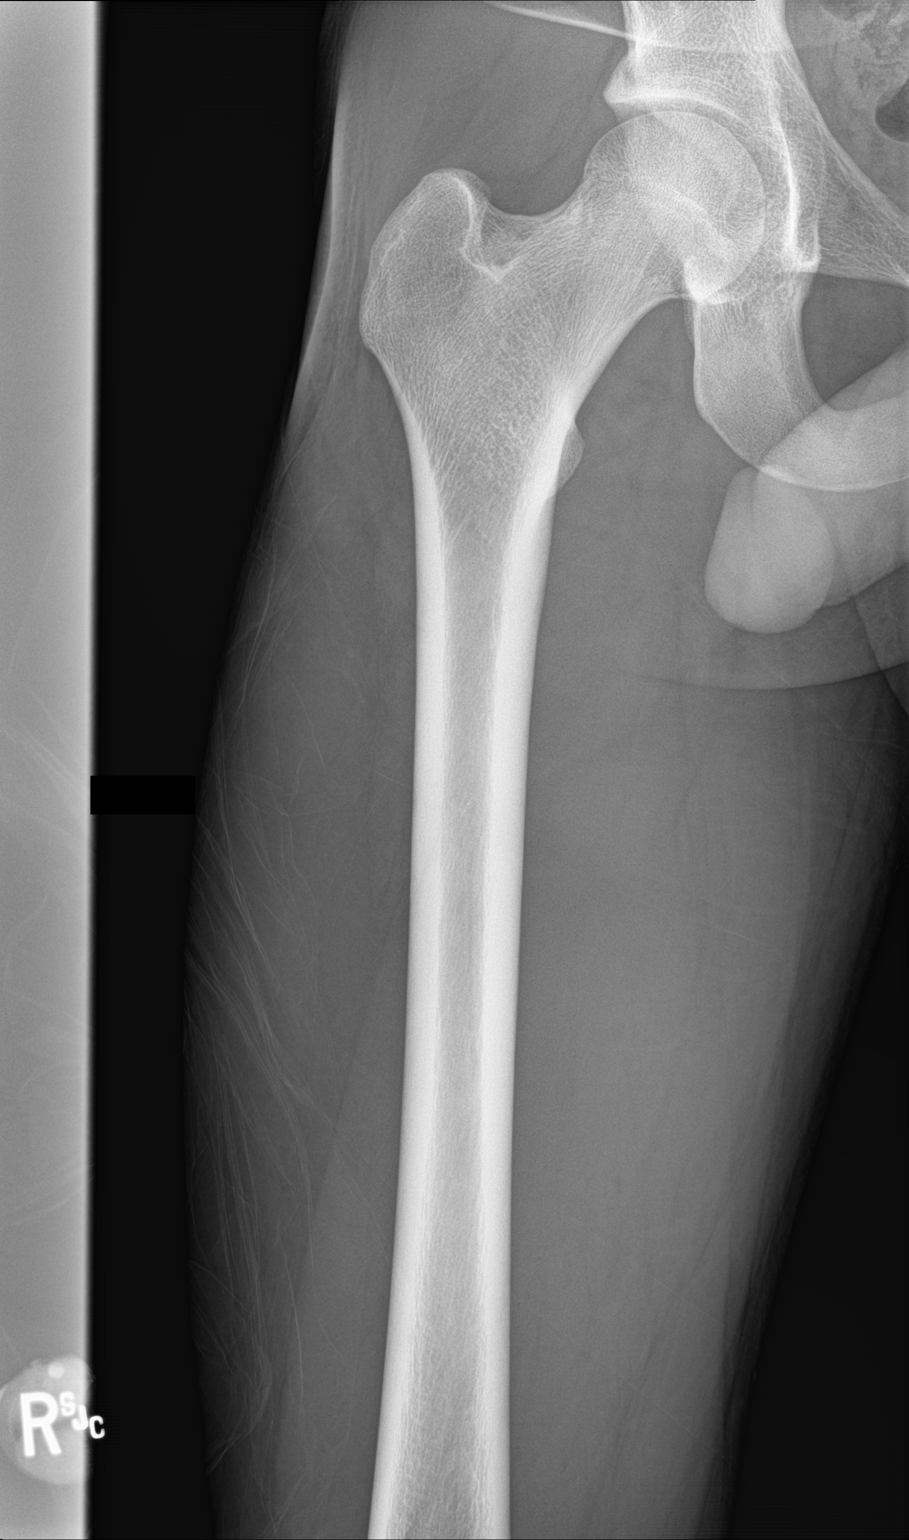

[3 of 3 positions shown; findings below may reference images not displayed]

FINDINGS: The right hip joint is maintained. The femur is intact. No
radiopaque foreign body is identified. There is a BB marking the
site of the puncture wound.
IMPRESSION: No fracture or radiopaque foreign body.
